# Patient Record
Sex: Male | Born: 1955 | Race: White | Hispanic: No | Smoking: Former smoker
Health system: Southern US, Community
[De-identification: ages and names within clinical notes are randomized; demographics above are authoritative.]

## PROBLEM LIST (undated history)

## (undated) DIAGNOSIS — K219 Gastro-esophageal reflux disease without esophagitis: Secondary | ICD-10-CM

## (undated) DIAGNOSIS — J45909 Unspecified asthma, uncomplicated: Secondary | ICD-10-CM

## (undated) DIAGNOSIS — I10 Essential (primary) hypertension: Secondary | ICD-10-CM

## (undated) DIAGNOSIS — E785 Hyperlipidemia, unspecified: Secondary | ICD-10-CM

## (undated) DIAGNOSIS — M109 Gout, unspecified: Secondary | ICD-10-CM

## (undated) HISTORY — PX: TONSILLECTOMY: SUR1361

## (undated) HISTORY — PX: BACK SURGERY: SHX140

## (undated) HISTORY — PX: SPINAL FUSION: SHX223

---

## 1998-02-01 ENCOUNTER — Ambulatory Visit (HOSPITAL_COMMUNITY): Admission: RE | Admit: 1998-02-01 | Discharge: 1998-02-01 | Payer: Self-pay | Admitting: Family Medicine

## 2001-07-16 ENCOUNTER — Encounter: Payer: Self-pay | Admitting: Neurosurgery

## 2001-07-17 ENCOUNTER — Encounter: Payer: Self-pay | Admitting: Neurosurgery

## 2001-07-17 ENCOUNTER — Ambulatory Visit (HOSPITAL_COMMUNITY): Admission: RE | Admit: 2001-07-17 | Discharge: 2001-07-17 | Payer: Self-pay | Admitting: Neurosurgery

## 2002-03-15 ENCOUNTER — Ambulatory Visit (HOSPITAL_COMMUNITY): Admission: RE | Admit: 2002-03-15 | Discharge: 2002-03-15 | Payer: Self-pay | Admitting: Neurosurgery

## 2002-03-15 ENCOUNTER — Encounter: Payer: Self-pay | Admitting: Neurosurgery

## 2002-06-01 ENCOUNTER — Emergency Department (HOSPITAL_COMMUNITY): Admission: EM | Admit: 2002-06-01 | Discharge: 2002-06-01 | Payer: Self-pay | Admitting: Emergency Medicine

## 2002-06-01 ENCOUNTER — Encounter: Payer: Self-pay | Admitting: Emergency Medicine

## 2002-06-12 ENCOUNTER — Encounter: Admission: RE | Admit: 2002-06-12 | Discharge: 2002-06-12 | Payer: Self-pay | Admitting: Surgery

## 2002-06-12 ENCOUNTER — Encounter: Payer: Self-pay | Admitting: Surgery

## 2004-01-04 ENCOUNTER — Encounter: Admission: RE | Admit: 2004-01-04 | Discharge: 2004-01-04 | Payer: Self-pay | Admitting: Family Medicine

## 2004-01-18 ENCOUNTER — Encounter: Admission: RE | Admit: 2004-01-18 | Discharge: 2004-01-18 | Payer: Self-pay | Admitting: Family Medicine

## 2004-01-29 ENCOUNTER — Encounter: Admission: RE | Admit: 2004-01-29 | Discharge: 2004-01-29 | Payer: Self-pay | Admitting: Family Medicine

## 2004-08-03 ENCOUNTER — Ambulatory Visit: Payer: Self-pay | Admitting: Family Medicine

## 2004-08-12 ENCOUNTER — Encounter: Admission: RE | Admit: 2004-08-12 | Discharge: 2004-11-10 | Payer: Self-pay | Admitting: Family Medicine

## 2004-09-06 ENCOUNTER — Ambulatory Visit: Payer: Self-pay | Admitting: Endocrinology

## 2004-11-29 ENCOUNTER — Ambulatory Visit: Payer: Self-pay | Admitting: Family Medicine

## 2005-01-16 ENCOUNTER — Ambulatory Visit: Payer: Self-pay | Admitting: Family Medicine

## 2005-02-16 ENCOUNTER — Ambulatory Visit: Payer: Self-pay | Admitting: Family Medicine

## 2005-03-03 ENCOUNTER — Encounter: Admission: RE | Admit: 2005-03-03 | Discharge: 2005-03-03 | Payer: Self-pay | Admitting: Neurosurgery

## 2005-03-05 ENCOUNTER — Encounter: Admission: RE | Admit: 2005-03-05 | Discharge: 2005-03-05 | Payer: Self-pay | Admitting: Neurosurgery

## 2005-03-08 ENCOUNTER — Ambulatory Visit: Payer: Self-pay | Admitting: Endocrinology

## 2005-03-27 ENCOUNTER — Emergency Department (HOSPITAL_COMMUNITY): Admission: EM | Admit: 2005-03-27 | Discharge: 2005-03-27 | Payer: Self-pay | Admitting: Emergency Medicine

## 2005-05-18 ENCOUNTER — Ambulatory Visit: Payer: Self-pay | Admitting: Family Medicine

## 2005-05-23 ENCOUNTER — Ambulatory Visit: Payer: Self-pay | Admitting: Family Medicine

## 2005-05-30 ENCOUNTER — Ambulatory Visit: Payer: Self-pay | Admitting: Family Medicine

## 2005-06-05 ENCOUNTER — Ambulatory Visit: Payer: Self-pay | Admitting: Family Medicine

## 2005-06-08 ENCOUNTER — Ambulatory Visit: Payer: Self-pay | Admitting: Gastroenterology

## 2005-06-21 ENCOUNTER — Ambulatory Visit: Payer: Self-pay | Admitting: Gastroenterology

## 2005-06-29 ENCOUNTER — Ambulatory Visit: Payer: Self-pay | Admitting: Family Medicine

## 2005-08-14 ENCOUNTER — Ambulatory Visit: Payer: Self-pay | Admitting: Family Medicine

## 2005-11-22 ENCOUNTER — Ambulatory Visit: Payer: Self-pay | Admitting: Internal Medicine

## 2005-11-22 ENCOUNTER — Observation Stay (HOSPITAL_COMMUNITY): Admission: EM | Admit: 2005-11-22 | Discharge: 2005-11-23 | Payer: Self-pay | Admitting: Emergency Medicine

## 2005-12-01 ENCOUNTER — Ambulatory Visit: Payer: Self-pay | Admitting: Family Medicine

## 2006-04-20 ENCOUNTER — Ambulatory Visit (HOSPITAL_BASED_OUTPATIENT_CLINIC_OR_DEPARTMENT_OTHER): Admission: RE | Admit: 2006-04-20 | Discharge: 2006-04-20 | Payer: Self-pay | Admitting: Orthopaedic Surgery

## 2006-11-26 ENCOUNTER — Ambulatory Visit: Payer: Self-pay | Admitting: Family Medicine

## 2007-05-21 DIAGNOSIS — K219 Gastro-esophageal reflux disease without esophagitis: Secondary | ICD-10-CM

## 2007-05-21 DIAGNOSIS — I10 Essential (primary) hypertension: Secondary | ICD-10-CM | POA: Insufficient documentation

## 2007-05-21 DIAGNOSIS — Z860100 Personal history of colon polyps, unspecified: Secondary | ICD-10-CM | POA: Insufficient documentation

## 2007-05-21 DIAGNOSIS — Z8719 Personal history of other diseases of the digestive system: Secondary | ICD-10-CM

## 2007-05-21 DIAGNOSIS — R519 Headache, unspecified: Secondary | ICD-10-CM | POA: Insufficient documentation

## 2007-05-21 DIAGNOSIS — R51 Headache: Secondary | ICD-10-CM | POA: Insufficient documentation

## 2007-07-29 ENCOUNTER — Ambulatory Visit: Payer: Self-pay | Admitting: Family Medicine

## 2007-07-29 DIAGNOSIS — J018 Other acute sinusitis: Secondary | ICD-10-CM

## 2007-10-22 ENCOUNTER — Ambulatory Visit: Payer: Self-pay | Admitting: Family Medicine

## 2007-10-22 DIAGNOSIS — J069 Acute upper respiratory infection, unspecified: Secondary | ICD-10-CM | POA: Insufficient documentation

## 2007-10-22 DIAGNOSIS — N6089 Other benign mammary dysplasias of unspecified breast: Secondary | ICD-10-CM

## 2008-03-09 ENCOUNTER — Ambulatory Visit: Payer: Self-pay | Admitting: Family Medicine

## 2008-03-10 LAB — CONVERTED CEMR LAB
AST: 22 units/L (ref 0–37)
Alkaline Phosphatase: 38 units/L — ABNORMAL LOW (ref 39–117)
Basophils Absolute: 0.1 10*3/uL (ref 0.0–0.1)
Bilirubin, Direct: 0.1 mg/dL (ref 0.0–0.3)
CO2: 30 meq/L (ref 19–32)
Creatinine, Ser: 0.9 mg/dL (ref 0.4–1.5)
Glucose, Bld: 88 mg/dL (ref 70–99)
HCT: 44.3 % (ref 39.0–52.0)
Hemoglobin: 15.7 g/dL (ref 13.0–17.0)
MCV: 87.8 fL (ref 78.0–100.0)
Neutrophils Relative %: 60.1 % (ref 43.0–77.0)
Platelets: 165 10*3/uL (ref 150–400)
Potassium: 4.4 meq/L (ref 3.5–5.1)
Sodium: 143 meq/L (ref 135–145)
TSH: 0.81 microintl units/mL (ref 0.35–5.50)
Total Protein: 6.5 g/dL (ref 6.0–8.3)

## 2008-03-23 ENCOUNTER — Emergency Department (HOSPITAL_COMMUNITY): Admission: EM | Admit: 2008-03-23 | Discharge: 2008-03-23 | Payer: Self-pay | Admitting: Emergency Medicine

## 2008-08-05 ENCOUNTER — Ambulatory Visit: Payer: Self-pay | Admitting: Family Medicine

## 2008-08-05 DIAGNOSIS — J209 Acute bronchitis, unspecified: Secondary | ICD-10-CM | POA: Insufficient documentation

## 2008-11-03 ENCOUNTER — Ambulatory Visit: Payer: Self-pay | Admitting: Family Medicine

## 2008-11-03 DIAGNOSIS — L089 Local infection of the skin and subcutaneous tissue, unspecified: Secondary | ICD-10-CM | POA: Insufficient documentation

## 2008-11-20 ENCOUNTER — Ambulatory Visit: Payer: Self-pay | Admitting: Family Medicine

## 2009-03-25 ENCOUNTER — Ambulatory Visit: Payer: Self-pay | Admitting: Family Medicine

## 2009-03-25 DIAGNOSIS — L0293 Carbuncle, unspecified: Secondary | ICD-10-CM

## 2009-03-25 DIAGNOSIS — L0292 Furuncle, unspecified: Secondary | ICD-10-CM | POA: Insufficient documentation

## 2009-03-25 DIAGNOSIS — F411 Generalized anxiety disorder: Secondary | ICD-10-CM | POA: Insufficient documentation

## 2009-04-06 ENCOUNTER — Ambulatory Visit: Payer: Self-pay | Admitting: Family Medicine

## 2010-10-02 ENCOUNTER — Encounter: Payer: Self-pay | Admitting: Neurosurgery

## 2011-01-27 NOTE — Discharge Summary (Signed)
NAMERICHMOND, COLDREN                 ACCOUNT NO.:  [id number]   MEDICAL RECORD NO.:  [id number]          PATIENT TYPE:  INP   LOCATION:  4731                         FACILITY:  [facility]   PHYSICIAN:  Rene Paci, M.D. LHCDATE OF BIRTH:  22-Aug-1956   DATE OF ADMISSION:  11/22/2005  DATE OF DISCHARGE:  11/23/2005                                 DISCHARGE SUMMARY   DISCHARGE DIAGNOSES:  1.  Atypical chest pain.  2.  Dyslipidemia.  3.  Depression/anxiety.   HISTORY OF PRESENT ILLNESS:  The patient is a 55 year old white male  admitted on November 22, 2005 with chief complaint of chest pain which is left-  sided, substernal, with radiation to the shoulder and neck.  This chest pain  began on the morning of admission.  The patient's neighbor, who is a  Designer, fashion/clothing, checked his blood pressure and noted a systolic  blood pressure at home of 90 per patient.  He had noted that the chest pain  had been intermittent, but had resolved at the time of his evaluation in the  emergency room.  Of note, the patient has a history of low back pain and had  received an epidural steroid injection on November 21, 2005.   PAST MEDICAL HISTORY:  1.  Chronic back pain.  2.  Hypertension.  3.  Depression/anxiety.   COURSE OF HOSPITALIZATION:  PROBLEM #1 - ATYPICAL CHEST PAIN:  The patient  was admitted and he underwent serial cardiac enzymes, which were negative  x3.  EKG showed sinus bradycardia with no changes.  He was maintained on  telemetry overnight.  He was noted to have an episode of bradycardia with a  heart rate of 49 and some mild hypotension with a BP of 97/61; his Lotrel  was held during this admission and his heart rate at time of discharge is  currently 70 beats per minute.   PROBLEM #2 - DYSLIPIDEMIA:  The patient was noted to have a total  cholesterol level of 178, HDL of 35 and an LDL of 112.  He states he walks  approximately 1 hour a day 5-6 times per week and that he has been  working  on a low-cholesterol diet.  He was encouraged to continue exercise and diet  modification.   PROBLEM #3 - HYPERTENSION:  This patient's blood pressure was low during  this admission and it may be likely secondary to reaction to recent epidural  steroid injection.  We have advised the patient to hold his Lotrel until his  followup appointment with Dr. Clent Ridges.   MEDICATIONS AT DISCHARGE:  1.  Percocet 7.5/325 mg one tab p.o. q.6 h. p.r.n.  2.  Gabapentin 300 mg p.o. at bedtime.  3.  Lasix 40 mg p.o. daily.  4.  Potassium chloride 20 mEq p.o. daily.  5.  Clonazepam 1 mg p.o. 3 times daily as needed.  6.  Lotrel -- the patient is to hold until follow up with in the hospital      Dr. Abran Cantor.  7.  Cyclobenzaprine 10 mg 3 times daily as needed.  FOLLOWUP:  The patient is to follow up with Dr. Clent Ridges in 1-2 weeks.   DISPOSITION:  Plan is to discharge the patient to home.   DISCHARGE INSTRUCTIONS:  He is instructed to quit using chewing tobacco and  to return to the ER should he develop chest pain, weakness or shortness of  breath.      Melissa S. Peggyann Juba, NP      Rene Paci, M.D. [facility]  Electronically Signed    MSO/MEDQ  D:  11/23/2005  T:  11/25/2005  Job:  [id number]   cc:   Jeannett Senior A. Clent Ridges, M.D. [facility]  [address] [place]  [place] [address]

## 2011-01-27 NOTE — Op Note (Signed)
NAMEJEMAL, Sean Duffy                 ACCOUNT NO.:  [id number]   MEDICAL RECORD NO.:  [id number]          PATIENT TYPE:  AMB   LOCATION:  DSC                          FACILITY:  [facility]   PHYSICIAN:  Sharolyn Douglas, M.D.        DATE OF BIRTH:  1955/12/05   DATE OF PROCEDURE:  04/20/2006  DATE OF DISCHARGE:                                 OPERATIVE REPORT   PREOPERATIVE DIAGNOSES:  1. L5-S1 spondylosis with back and right lower extremity pain.  2. Post laminectomy syndrome.   POSTOPERATIVE DIAGNOSES:  1. L5-S1 spondylosis with back and right lower extremity pain.  2. Post laminectomy syndrome.   PROCEDURE:  1. Bilateral L5-S1 facette joint injections.  2. Right L5 selective nerve root block.  3. Fluoroscopy used for needle placement for the above procedures.   SURGEON:  Sharolyn Douglas, MD.   ASSISTANT:  None.   ANESTHESIA:  MAC plus local.   COMPLICATIONS:  None.   INDICATIONS:  The patient is a pleasant 55 year old male with persistent  back and right lower extremity pain.  He has had a previous laminotomy at L5-  S1.  He has failed to respond to other conservative treatment measures and  now elects to undergo bilateral L5-S1 facette injections and right L5  selective nerve root block in hopes of improving his symptoms.  The risks  and benefits were reviewed.   DESCRIPTION OF PROCEDURE:  He was identified in the holding area after  informed consent and taken to the operating room.  He was turned prone.  Given sedation.  The back was prepped and draped in the usual sterile  fashion.  Fluoroscopy brought into the field.  The L5-S1 facette joints were  identified.  Small 1 mL of 1% lidocaine used over the planned injection site  bilaterally.  A 22-gauge 3-1/2 inch spinal needle then advanced towards the  L5-S1 facette joints bilaterally.  Using fluoroscopy, the needle was placed  at the superior portion of the joint.  1 mL of Omnipaque injected confirming  intra-articular spread  bilaterally.  We then injected 20 mg of Depo-Medrol  and 1 mL of 1% lidocaine into each facette joint.  We then turned our  attention to performing a right L5 selective nerve root block.  Oblique  imaging of the L5 vertebral body was obtained.  A 22gauge Quincke needle was  placed using fluoroscopy under the 6 o'clock position of the right L5  pedicle.  Aspiration was performed, there was no blood, no  CSF.  We then injected a 1/2 mL of Omnipaque confirming epidural spread.  A  solution of 120 mg Depo-Medrol and 3 mL 1% preservative-free lidocaine then  injected.  Band-Aids were placed.  The patient was transferred to recovery  in stable condition.  Post injection instructions were reviewed.  He will  follow-up in 2 weeks.      Sharolyn Douglas, M.D.  Electronically Signed     MC/MEDQ  D:  04/20/2006  T:  04/20/2006  Job:  [id number]

## 2011-08-19 ENCOUNTER — Encounter: Payer: Self-pay | Admitting: Family Medicine

## 2011-08-19 ENCOUNTER — Ambulatory Visit (INDEPENDENT_AMBULATORY_CARE_PROVIDER_SITE_OTHER): Payer: Self-pay | Admitting: Family Medicine

## 2011-08-19 VITALS — BP 158/100 | HR 52 | Temp 98.0°F | Wt 201.0 lb

## 2011-08-19 DIAGNOSIS — J209 Acute bronchitis, unspecified: Secondary | ICD-10-CM

## 2011-08-19 MED ORDER — PREDNISONE 20 MG PO TABS: ORAL_TABLET | ORAL | Status: DC

## 2011-08-19 MED ORDER — AZITHROMYCIN 250 MG PO TABS: ORAL_TABLET | ORAL | Status: DC

## 2011-08-19 NOTE — Progress Notes (Signed)
OFFICE NOTE  08/20/2011  CC:  Chief Complaint  Patient presents with  . Cough    productive w/thick mucus  . Sinusitis    x 1 week     HPI:   Patient is a 55 y.o. Caucasian male who is here for cough/resp sx's. Pt presents complaining of respiratory symptoms for 12-14 days.  Minimal nasal congestion/runny nose, sneezing, and PND cough-- describes the worst symptoms as being chest tightness, coughing excessively, +productive.  Lately the symptoms seem to be worsening . No fevers, no wheezing, and no SOB.  No pain in face or teeth.  No significant HA.  ST mild at most.  Symptoms made worse by deep breaths..  Symptoms improved by mucinex and nyquil minimally. Smoker? no Recent sick contact? No  Muscle or joint aches? No No flu vaccine this season  ROS: no n/v/d or abdominal pain.  No rash.  No neck stiffness.   +Mild fatigue.  +Mild appetite loss.   Pertinent PMH:  HTN Hx of acute bronchitis 3-4 times per year Anxiety  MEDS;  Mucinex, nyquil No outpatient prescriptions prior to visit.    PE: Blood pressure 158/100, pulse 52, temperature 98 F (36.7 C), temperature source Oral, weight 201 lb (91.173 kg), SpO2 95.00%. VS: noted--normal. Gen: alert, NAD, NONTOXIC APPEARING. HEENT: eyes without injection, drainage, or swelling.  Ears: EACs clear, TMs with normal light reflex and landmarks.  Nose: Clear rhinorrhea, with some dried, crusty exudate adherent to mildly injected mucosa.  No purulent d/c.  No paranasal sinus TTP.  No facial swelling.  Throat and mouth without focal lesion.  No pharyngial swelling, erythema, or exudate.   Neck: supple, no LAD.   LUNGS: CTA bilat, nonlabored resps.  Some post-exhalation coughing occasionally. CV: RRR, no m/r/g. EXT: no c/c/e SKIN: no rash    IMPRESSION AND PLAN:  ACUTE BRONCHITIS Prolonged, worsening. Prednisone 40mg  qd x 5d Z-pack. Ventolin sample given: 2 puffs q4h prn.   Avoid OTC decongestants-may increase your  BP.  FOLLOW UP:  Return if symptoms worsen or fail to improve.

## 2011-08-20 NOTE — Assessment & Plan Note (Signed)
Prolonged, worsening. Prednisone 40mg  qd x 5d Z-pack. Ventolin sample given: 2 puffs q4h prn.

## 2015-05-07 ENCOUNTER — Encounter: Payer: Self-pay | Admitting: Gastroenterology

## 2017-03-05 ENCOUNTER — Emergency Department (HOSPITAL_BASED_OUTPATIENT_CLINIC_OR_DEPARTMENT_OTHER)
Admission: EM | Admit: 2017-03-05 | Discharge: 2017-03-05 | Disposition: A | Discharge status: Home / Self Care | Payer: Non-veteran care | Attending: Emergency Medicine | Admitting: Emergency Medicine

## 2017-03-05 ENCOUNTER — Encounter (HOSPITAL_BASED_OUTPATIENT_CLINIC_OR_DEPARTMENT_OTHER): Payer: Self-pay | Admitting: *Deleted

## 2017-03-05 DIAGNOSIS — J3489 Other specified disorders of nose and nasal sinuses: Secondary | ICD-10-CM

## 2017-03-05 DIAGNOSIS — Z79899 Other long term (current) drug therapy: Secondary | ICD-10-CM | POA: Diagnosis not present

## 2017-03-05 DIAGNOSIS — J45909 Unspecified asthma, uncomplicated: Secondary | ICD-10-CM | POA: Diagnosis not present

## 2017-03-05 DIAGNOSIS — Z87891 Personal history of nicotine dependence: Secondary | ICD-10-CM | POA: Diagnosis not present

## 2017-03-05 DIAGNOSIS — I10 Essential (primary) hypertension: Secondary | ICD-10-CM | POA: Insufficient documentation

## 2017-03-05 DIAGNOSIS — R600 Localized edema: Secondary | ICD-10-CM | POA: Diagnosis present

## 2017-03-05 HISTORY — DX: Unspecified asthma, uncomplicated: J45.909

## 2017-03-05 HISTORY — DX: Essential (primary) hypertension: I10

## 2017-03-05 HISTORY — DX: Gastro-esophageal reflux disease without esophagitis: K21.9

## 2017-03-05 HISTORY — DX: Hyperlipidemia, unspecified: E78.5

## 2017-03-05 HISTORY — DX: Gout, unspecified: M10.9

## 2017-03-05 MED ORDER — HYDROCODONE-ACETAMINOPHEN 5-325 MG PO TABS
1.0000 | ORAL_TABLET | Freq: Once | ORAL | Status: AC
  Administered 2017-03-05: 1 via ORAL
  Filled 2017-03-05: qty 1

## 2017-03-05 MED ORDER — CEPHALEXIN 250 MG PO CAPS
500.0000 mg | ORAL_CAPSULE | Freq: Once | ORAL | Status: AC
  Administered 2017-03-05: 500 mg via ORAL
  Filled 2017-03-05: qty 2

## 2017-03-05 MED ORDER — CEPHALEXIN 500 MG PO CAPS: ORAL_CAPSULE | ORAL | 0 refills | Status: DC

## 2017-03-05 MED ORDER — HYDROCODONE-ACETAMINOPHEN 5-325 MG PO TABS: 1.0000 | ORAL_TABLET | Freq: Four times a day (QID) | ORAL | 0 refills | Status: AC | PRN

## 2017-03-05 NOTE — Discharge Instructions (Signed)
Take Tylenol for mild pain or the pain medicine prescribed for bad pain. Don't take Tylenol together with the pain medicine prescribed as the combination could be dangerous to your liver. It is best to avoid ibuprofen since you have issues with blood pressure and ibuprofen can harm her kidneys. Call Dr. Thurmon FairShoemaker's office tomorrow to schedule next available appointment

## 2017-03-05 NOTE — ED Notes (Signed)
Pt verbalizes understanding of d/c instructions and denies any further need at this time. 

## 2017-03-05 NOTE — ED Notes (Signed)
Pt's wife came to the desk asking when the doctor will be in to see him because he is still in pain.  Pt offered tylenol again and he states that the ibuprofen was not helpful so he refused the tylenol again.

## 2017-03-05 NOTE — ED Triage Notes (Signed)
Pt c/o ? Abscess in right nare x 4 days

## 2017-03-05 NOTE — ED Provider Notes (Signed)
[facility] Note   CSN: [id number] Arrival date & time: 03/05/17  1753  By signing my name below, I, Vista Mink, attest that this documentation has been prepared under the direction and in the presence of Doug Sou, MD. Electronically signed, Vista Mink, ED Scribe. 03/05/17. 9:03 PM.  History   Chief Complaint Chief Complaint  Patient presents with  . Facial Swelling    HPI HPI Comments: Sean Duffy is a 61 y.o. male, with Hx of HTN, HLD, who presents to the Emergency Department complaining of gradually worsening pain in his right nostril that started two days ago. He rates his pain an 8/10 currently and also reports associated swelling that he describes at the top of the right nostril. Pt was seen at the [place] earlier today and he states that the provider there was unable to visualize anything in his right nostril. He does not have a current ENT doctor to follow up with and pt is requesting a referral. Pt also reports clear drainage from his nose, no pain in the eye. Per triage note, pt took 800mg  of ibuprofen prior to arrival with no relief of pain. No other associated symptoms noted. Pt denies any illicit drug use, alcohol use or tobacco use.  The history is provided by the patient. No language interpreter was used.    Past Medical History:  Diagnosis Date  . Asthma   . GERD (gastroesophageal reflux disease)   . Gout   . Hyperlipidemia   . Hypertension     Patient Active Problem List   Diagnosis Date Noted  . ANXIETY STATE, UNSPECIFIED 03/25/2009  . FURUNCLE 03/25/2009  . UNSPEC LOCAL INFECTION SKIN&SUBCUTANEOUS TISSUE 11/03/2008  . ACUTE BRONCHITIS 08/05/2008  . VIRAL URI 10/22/2007  . SEBACEOUS CYST, BREAST 10/22/2007  . OTHER ACUTE SINUSITIS 07/29/2007  . HYPERTENSION 05/21/2007  . GERD 05/21/2007  . HEADACHE 05/21/2007  . COLONIC POLYPS, HX OF 05/21/2007  . DIVERTICULITIS, HX OF 05/21/2007    Past Surgical History:  Procedure  Laterality Date  . BACK SURGERY    . SPINAL FUSION    . TONSILLECTOMY        Home Medications    Prior to Admission medications   Medication Sig Start Date End Date Taking? Authorizing Provider  albuterol (PROVENTIL HFA;VENTOLIN HFA) 108 (90 Base) MCG/ACT inhaler Inhale into the lungs every 6 (six) hours as needed for wheezing or shortness of breath.   Yes [provider]  allopurinol (ZYLOPRIM) 300 MG tablet Take 300 mg by mouth daily.   Yes [provider]  amLODipine (NORVASC) 5 MG tablet Take 5 mg by mouth daily.   Yes [provider]  atorvastatin (LIPITOR) 40 MG tablet Take 40 mg by mouth daily.   Yes [provider]  omeprazole (PRILOSEC) 40 MG capsule Take 40 mg by mouth daily.   Yes [provider]  Ascorbic Acid (VITAMIN C) 100 MG tablet Take 100 mg by mouth daily.      [provider]  fish oil-omega-3 fatty acids 1000 MG capsule Take 2 g by mouth daily.      [provider]  vitamin E 100 UNIT capsule Take 100 Units by mouth daily.      [provider]    Family History History reviewed. No pertinent family history.  Social History Social History  Substance Use Topics  . Smoking status: Former Smoker    Types: Cigars    Quit date: 08/18/1996  . Smokeless tobacco:  Not on file  . Alcohol use No     Allergies   Sulfonamide derivatives   Review of Systems Review of Systems  Constitutional: Positive for fever.  HENT: Positive for rhinorrhea.        Pain  in in nose  All other systems reviewed and are negative.    Physical Exam Updated Vital Signs BP (!) 147/86   Pulse 75   Temp 98.6 F (37 C)   Resp 16   Ht 5\' 9"  (1.753 m)   Wt 230 lb (104.3 kg)   SpO2 100%   BMI 33.97 kg/m   Physical Exam  Constitutional: He is oriented to person, place, and time. He appears well-developed and well-nourished. No distress.  HENT:  Head: Normocephalic and atraumatic.  Right Nasal mucosa red  appearing and swollen. No obvious abscess or fluctuance. Right side of nose tender.  Cardiovascular: Normal rate.   Pulmonary/Chest: Effort normal.  Abdominal: He exhibits no distension.  Musculoskeletal: Normal range of motion.  Neurological: He is oriented to person, place, and time. No cranial nerve deficit.  Nursing note and vitals reviewed.    ED Treatments / Results  DIAGNOSTIC STUDIES: Oxygen Saturation is 100% on RA, normal by my interpretation.  COORDINATION OF CARE: 9:03 PM-Discussed treatment plan with pt at bedside and pt agreed to plan.   Labs (all labs ordered are listed, but only abnormal results are displayed) Labs Reviewed - No data to display  EKG  EKG Interpretation None       Radiology No results found.  Procedures Procedures (including critical care time)  Medications Ordered in ED Medications - No data to display   Initial Impression / Assessment and Plan / ED Course  I have reviewed the triage vital signs and the nursing notes.  Pertinent labs & imaging results that were available during my care of the patient were reviewed by me and considered in my medical decision making (see chart for details).     Suspect cellulitis or soft tissue infection. He did not have proper instrumentation to fully examine inside of patient's nose Plan prescription Keflex, Norco. [facility] Clinical [facility]ressions([facility]) / ED Diagnoses  Dx nasal pain Final diagnoses:  None    New Prescriptions New Prescriptions   No medications on file  I personally performed the services described in this documentation, which was scribed in my presence. The recorded information has been reviewed and considered.    Doug SouJacubowitz, Deejay Koppelman, MD 03/05/17 2113

## 2017-03-05 NOTE — ED Notes (Signed)
Pt rang out bc his pain is a "9 out of 10."  Pt took 800mg  ibuprofen prior to arrival, offered pt tylenol, he refused stating "that's not going to work."

## 2017-03-31 ENCOUNTER — Emergency Department (HOSPITAL_COMMUNITY)
Admission: EM | Admit: 2017-03-31 | Discharge: 2017-03-31 | Disposition: A | Discharge status: Home / Self Care | Payer: Non-veteran care | Attending: Emergency Medicine | Admitting: Emergency Medicine

## 2017-03-31 ENCOUNTER — Encounter (HOSPITAL_COMMUNITY): Payer: Self-pay

## 2017-03-31 ENCOUNTER — Emergency Department (HOSPITAL_COMMUNITY): Payer: Non-veteran care

## 2017-03-31 DIAGNOSIS — W240XXA Contact with lifting devices, not elsewhere classified, initial encounter: Secondary | ICD-10-CM | POA: Insufficient documentation

## 2017-03-31 DIAGNOSIS — Y999 Unspecified external cause status: Secondary | ICD-10-CM | POA: Diagnosis not present

## 2017-03-31 DIAGNOSIS — Z79899 Other long term (current) drug therapy: Secondary | ICD-10-CM | POA: Diagnosis not present

## 2017-03-31 DIAGNOSIS — I10 Essential (primary) hypertension: Secondary | ICD-10-CM | POA: Diagnosis not present

## 2017-03-31 DIAGNOSIS — J45909 Unspecified asthma, uncomplicated: Secondary | ICD-10-CM | POA: Diagnosis not present

## 2017-03-31 DIAGNOSIS — Y9389 Activity, other specified: Secondary | ICD-10-CM | POA: Insufficient documentation

## 2017-03-31 DIAGNOSIS — Z87891 Personal history of nicotine dependence: Secondary | ICD-10-CM | POA: Insufficient documentation

## 2017-03-31 DIAGNOSIS — Y929 Unspecified place or not applicable: Secondary | ICD-10-CM | POA: Insufficient documentation

## 2017-03-31 DIAGNOSIS — T148XXA Other injury of unspecified body region, initial encounter: Secondary | ICD-10-CM

## 2017-03-31 DIAGNOSIS — S0081XA Abrasion of other part of head, initial encounter: Secondary | ICD-10-CM | POA: Diagnosis not present

## 2017-03-31 DIAGNOSIS — S098XXA Other specified injuries of head, initial encounter: Secondary | ICD-10-CM | POA: Diagnosis present

## 2017-03-31 DIAGNOSIS — S060X0A Concussion without loss of consciousness, initial encounter: Secondary | ICD-10-CM

## 2017-03-31 MED ORDER — HYDROCODONE-ACETAMINOPHEN 5-325 MG PO TABS
2.0000 | ORAL_TABLET | Freq: Once | ORAL | Status: AC
  Administered 2017-03-31: 2 via ORAL
  Filled 2017-03-31: qty 2

## 2017-03-31 NOTE — ED Provider Notes (Signed)
[facility]-EMERGENCY DEPT Provider Note   CSN: [id number] Arrival date & time: 03/31/17  1106     History   Chief Complaint No chief complaint on file.   HPI Sean Duffy is a 61 y.o. male.  Patient is 61 year old male who presents after a head injury. He was using a hydraulic jack and it bounced up and hit him in the forehead. He denies any loss of consciousness but states that he felt like he almost passed out. He's had some ongoing dizziness. He's had some nausea but no vomiting. His tetanus shot is up-to-date. He denies any other injuries from the incident. He initially denies any neck or back pain although he has some chronic neck and back pain related to prior disc disease. He currently denies any pain. He denies any numbness or weakness to his extremities.      Past Medical History:  Diagnosis Date  . Asthma   . GERD (gastroesophageal reflux disease)   . Gout   . Hyperlipidemia   . Hypertension     Patient Active Problem List   Diagnosis Date Noted  . ANXIETY STATE, UNSPECIFIED 03/25/2009  . FURUNCLE 03/25/2009  . UNSPEC LOCAL INFECTION SKIN&SUBCUTANEOUS TISSUE 11/03/2008  . ACUTE BRONCHITIS 08/05/2008  . VIRAL URI 10/22/2007  . SEBACEOUS CYST, BREAST 10/22/2007  . OTHER ACUTE SINUSITIS 07/29/2007  . HYPERTENSION 05/21/2007  . GERD 05/21/2007  . HEADACHE 05/21/2007  . COLONIC POLYPS, HX OF 05/21/2007  . DIVERTICULITIS, HX OF 05/21/2007    Past Surgical History:  Procedure Laterality Date  . BACK SURGERY    . SPINAL FUSION    . TONSILLECTOMY         Home Medications    Prior to Admission medications   Medication Sig Start Date End Date Taking? Authorizing Provider  albuterol (PROVENTIL HFA;VENTOLIN HFA) 108 (90 Base) MCG/ACT inhaler Inhale into the lungs every 6 (six) hours as needed for wheezing or shortness of breath.    [provider]  allopurinol (ZYLOPRIM) 300 MG tablet Take 300 mg by mouth daily.    [provider]  amLODipine  (NORVASC) 5 MG tablet Take 5 mg by mouth daily.    [provider]  Ascorbic Acid (VITAMIN C) 100 MG tablet Take 100 mg by mouth daily.      [provider]  atorvastatin (LIPITOR) 40 MG tablet Take 40 mg by mouth daily.    [provider]  cephALEXin (KEFLEX) 500 MG capsule One by mouth 3 times daily 03/05/17   Doug Sou, MD  fish oil-omega-3 fatty acids 1000 MG capsule Take 2 g by mouth daily.      [provider]  HYDROcodone-acetaminophen (NORCO) 5-325 MG tablet Take 1-2 tablets by mouth every 6 (six) hours as needed for severe pain. 03/05/17   Doug Sou, MD  omeprazole (PRILOSEC) 40 MG capsule Take 40 mg by mouth daily.    [provider]  vitamin E 100 UNIT capsule Take 100 Units by mouth daily.      [provider]    Family History No family history on file.  Social History Social History  Substance Use Topics  . Smoking status: Former Smoker    Types: Cigars    Quit date: 08/18/1996  . Smokeless tobacco: Never Used  . Alcohol use No     Allergies   Sulfonamide derivatives   Review of Systems Review of Systems  Constitutional: Negative for activity change, appetite change and fever.  HENT: Negative for dental  problem, nosebleeds and trouble swallowing.   Eyes: Negative for pain and visual disturbance.  Respiratory: Negative for shortness of breath.   Cardiovascular: Negative for chest pain.  Gastrointestinal: Negative for abdominal pain, nausea and vomiting.  Genitourinary: Negative for dysuria and hematuria.  Musculoskeletal: Positive for neck pain. Negative for arthralgias, back pain and joint swelling.  Skin: Positive for wound.  Neurological: Positive for headaches. Negative for weakness and numbness.  Psychiatric/Behavioral: Negative for confusion.     Physical Exam Updated Vital Signs BP (!) 149/96   Pulse 67   Temp 98.5 F (36.9 C) (Oral)   Resp 16   SpO2 95%   Physical Exam    Constitutional: He is oriented to person, place, and time. He appears well-developed and well-nourished.  HENT:  Head: Normocephalic.  Nose: Nose normal.  Positive abrasion with a superficial laceration to his mid forehead. There is no active bleeding.  Eyes: Pupils are equal, round, and reactive to light. Conjunctivae are normal.  Neck:  No pain to the cervical, thoracic, or LS spine.  No step-offs or deformities noted  Cardiovascular: Normal rate and regular rhythm.   No murmur heard. Pulmonary/Chest: Effort normal and breath sounds normal. No respiratory distress. He has no wheezes. He exhibits no tenderness.  Abdominal: Soft. Bowel sounds are normal. He exhibits no distension. There is no tenderness.  Musculoskeletal: Normal range of motion.  No pain on palpation or ROM of the extremities  Neurological: He is alert and oriented to person, place, and time.  Motor 5 out of 5 all extremities, sensation grossly intact to light touch all extremities  Skin: Skin is warm and dry. Capillary refill takes less than 2 seconds.  Psychiatric: He has a normal mood and affect.  Vitals reviewed.    ED Treatments / Results  Labs (all labs ordered are listed, but only abnormal results are displayed) Labs Reviewed - No data to display  EKG  EKG Interpretation None       Radiology Ct Head Wo Contrast  Result Date: 03/31/2017 CLINICAL DATA:  Hit forehead with hydraulic jack, abrasion/bruising EXAM: CT HEAD WITHOUT CONTRAST CT CERVICAL SPINE WITHOUT CONTRAST TECHNIQUE: Multidetector CT imaging of the head and cervical spine was performed following the standard protocol without intravenous contrast. Multiplanar CT image reconstructions of the cervical spine were also generated. COMPARISON:  MR cervical spine dated 03/05/2005. MR brain dated 01/29/2004. CT head dated 01/18/2004. FINDINGS: CT HEAD FINDINGS Brain: No evidence of acute infarction, hemorrhage, hydrocephalus, extra-axial collection or  mass lesion/mass effect. Vascular: No hyperdense vessel or unexpected calcification. Skull: Normal. Negative for fracture or focal lesion. Sinuses/Orbits: The visualized paranasal sinuses are essentially clear. The mastoid air cells are unopacified. Other: Mild soft tissue swelling overlying the right frontal bone. CT CERVICAL SPINE FINDINGS Alignment: Normal cervical lordosis. Skull base and vertebrae: No acute fracture. No primary bone lesion or focal pathologic process. Soft tissues and spinal canal: No prevertebral fluid or swelling. No visible canal hematoma. Disc levels:  Mild degenerative changes at C5-6. Spinal canal is patent. Upper chest: Visualized lung apices are clear. Other: Visualized thyroid is unremarkable. IMPRESSION: Mild soft tissue swelling overlying the right frontal bone. No evidence of acute intracranial abnormality. No evidence of traumatic injury to the cervical spine. Electronically Signed   By: Charline Bills M.D.   On: 03/31/2017 13:16   Ct Cervical Spine Wo Contrast  Result Date: 03/31/2017 CLINICAL DATA:  Hit forehead with hydraulic jack, abrasion/bruising EXAM: CT HEAD WITHOUT CONTRAST CT CERVICAL SPINE  WITHOUT CONTRAST TECHNIQUE: Multidetector CT imaging of the head and cervical spine was performed following the standard protocol without intravenous contrast. Multiplanar CT image reconstructions of the cervical spine were also generated. COMPARISON:  MR cervical spine dated 03/05/2005. MR brain dated 01/29/2004. CT head dated 01/18/2004. FINDINGS: CT HEAD FINDINGS Brain: No evidence of acute infarction, hemorrhage, hydrocephalus, extra-axial collection or mass lesion/mass effect. Vascular: No hyperdense vessel or unexpected calcification. Skull: Normal. Negative for fracture or focal lesion. Sinuses/Orbits: The visualized paranasal sinuses are essentially clear. The mastoid air cells are unopacified. Other: Mild soft tissue swelling overlying the right frontal bone. CT  CERVICAL SPINE FINDINGS Alignment: Normal cervical lordosis. Skull base and vertebrae: No acute fracture. No primary bone lesion or focal pathologic process. Soft tissues and spinal canal: No prevertebral fluid or swelling. No visible canal hematoma. Disc levels:  Mild degenerative changes at C5-6. Spinal canal is patent. Upper chest: Visualized lung apices are clear. Other: Visualized thyroid is unremarkable. IMPRESSION: Mild soft tissue swelling overlying the right frontal bone. No evidence of acute intracranial abnormality. No evidence of traumatic injury to the cervical spine. Electronically Signed   By: Charline BillsSriyesh  Krishnan M.D.   On: 03/31/2017 13:16    Procedures Procedures (including critical care time)  Medications Ordered in ED Medications  HYDROcodone-acetaminophen (NORCO/VICODIN) 5-325 MG per tablet 2 tablet (2 tablets Oral Given 03/31/17 1335)     Initial Impression / Assessment and Plan / ED Course  I have reviewed the triage vital signs and the nursing notes.  Pertinent labs & imaging results that were available during my care of the patient were reviewed by me and considered in my medical decision making (see chart for details).  Clinical Course as of Mar 31 1350  Sat Mar 31, 2017  1225 Patient states that he was moving his head around and developed onset of neck pain.  [MB]    Clinical Course User Index [MB] Rolan BuccoBelfi, Rena Hunke, MD    Pt Presents after a head injury. There is no evidence of intracranial hemorrhage. No evidence of spinal injury. He has no neurologic deficits. He is discharged home in good condition. He was given concussion instructions. He was given wound care instructions. He was instructed to follow-up with his PCP. Return precautions were given.  Final Clinical Impressions(s) / ED Diagnoses   Final diagnoses:  Concussion without loss of consciousness, initial encounter  Abrasion    New Prescriptions New Prescriptions   No medications on file       Rolan BuccoBelfi, Rehema Muffley, MD 03/31/17 1352

## 2017-03-31 NOTE — ED Triage Notes (Signed)
Patient here with abrasion/bruising to forehead after hitting self in head with jack this am. No loc but complains of dizziness, minimal bleeding

## 2017-09-08 IMAGING — CT CT CERVICAL SPINE W/O CM
5 of 8 series · 13 of 33 positions shown, 14 images · non-contrast
Comparison: MR cervical spine dated 03/05/2005. MR brain dated
01/29/2004. CT head dated 01/18/2004.

CLINICAL DATA: Hit forehead with hydraulic Aujla, abrasion/bruising

EXAM:
CT HEAD WITHOUT CONTRAST
CT CERVICAL SPINE WITHOUT CONTRAST
TECHNIQUE: Multidetector CT imaging of the head and cervical spine was
performed following the standard protocol without intravenous
contrast. Multiplanar CT image reconstructions of the cervical spine
were also generated.

[Series 5: head bone · axial · 0.51mm/px · z∈[-59,-5]mm · 2 of 82 slices shown]
[im 28/82  bone]
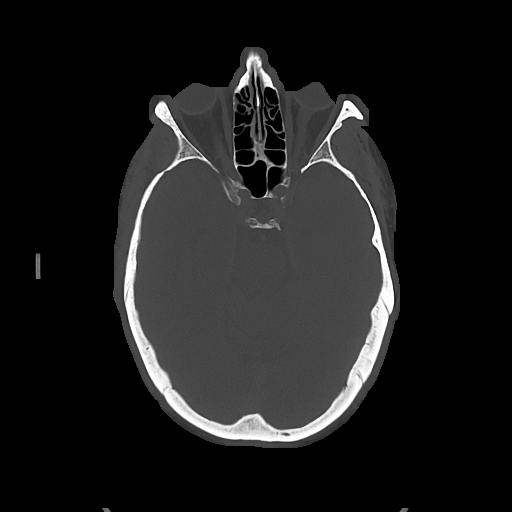
[im 55/82  bone]
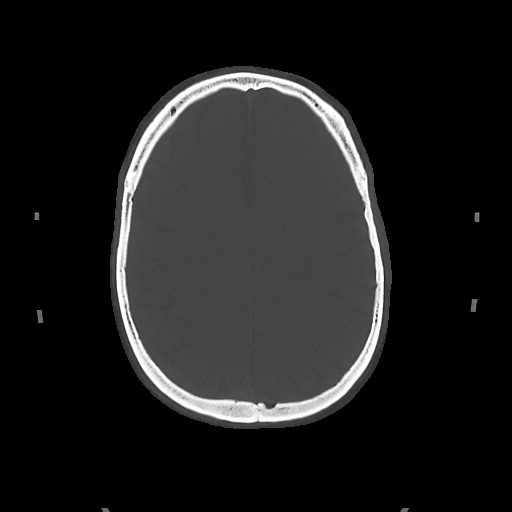

[Series 9: c spine soft · axial · 0.42mm/px · z∈[-215,-109]mm · 3 of 107 slices shown]
[im 27/107  soft-tissue]
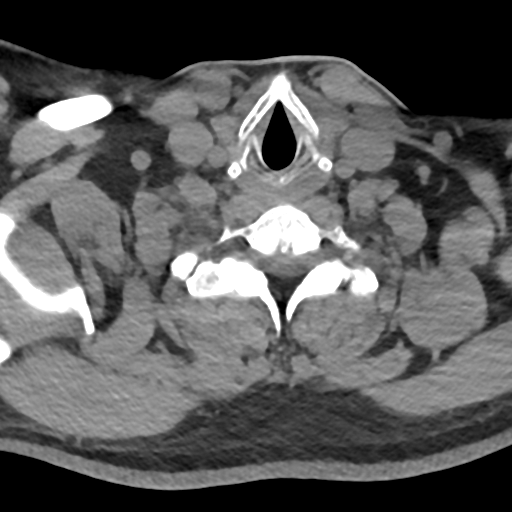
[im 54/107  soft-tissue]
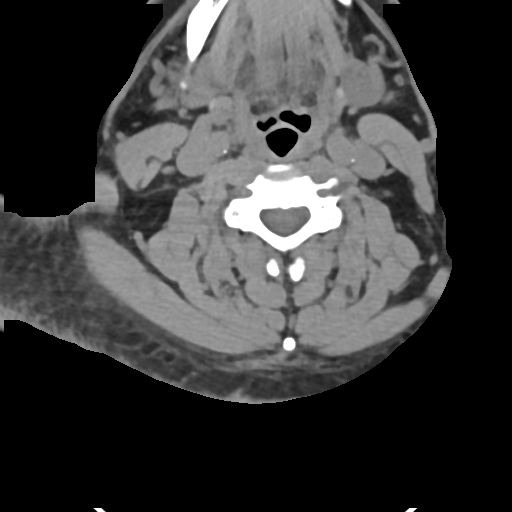
[im 80/107  soft-tissue]
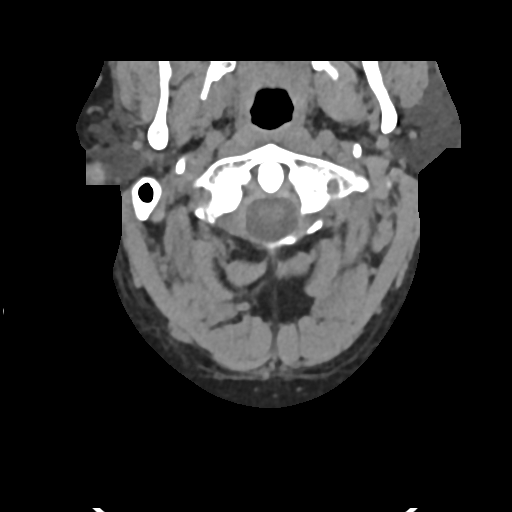

[Series 10: sag bone · sagittal · 0.42mm/px · 5 of 102 slices shown]
[im 15/102  bone]
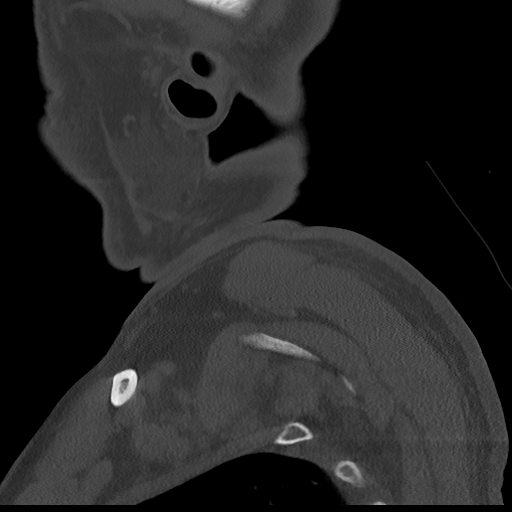
[im 29/102  bone]
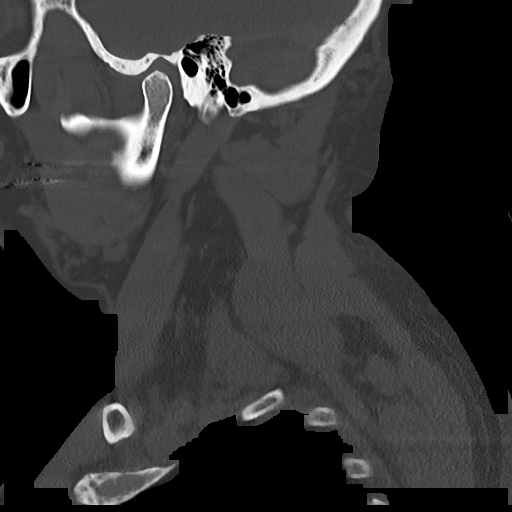
[im 44/102  bone]
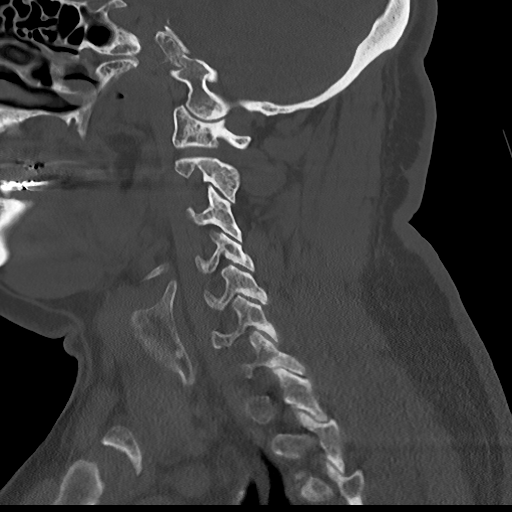
[im 58/102  bone]
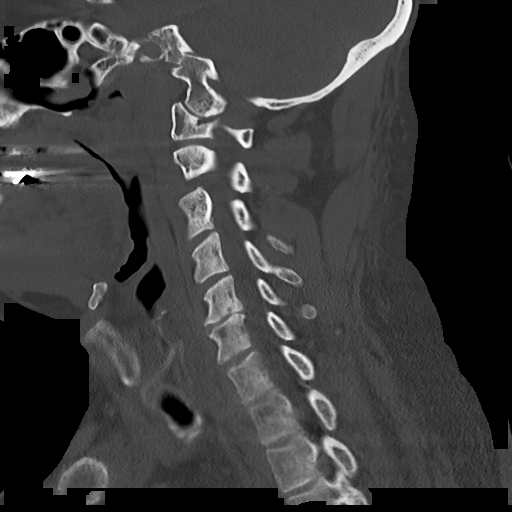
[im 73/102  bone]
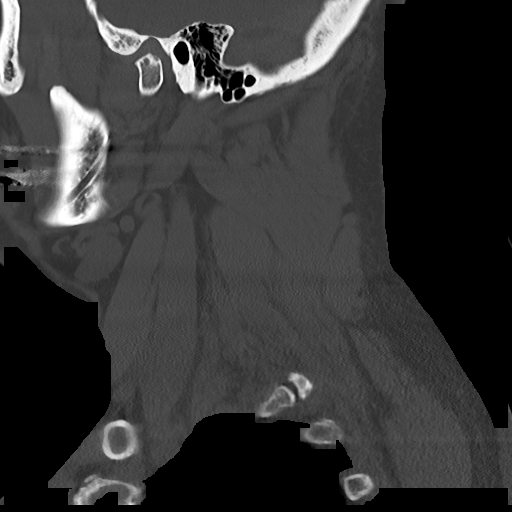

[Series 11: cor bone · coronal · 0.42mm/px · 1 of 103 slices shown]
[im 52/103  bone]
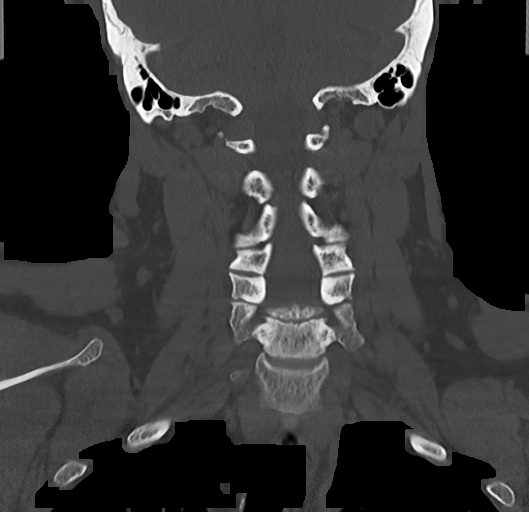

[Series 12: orthogonal axials · axial · 0.21mm/px · z∈[-219,-163]mm · 2 of 96 slices shown, 3 images]
[im 32/96  soft-tissue]
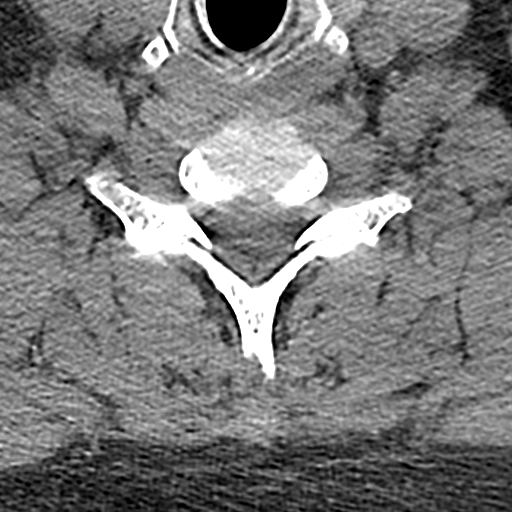
[im 32/96  bone]
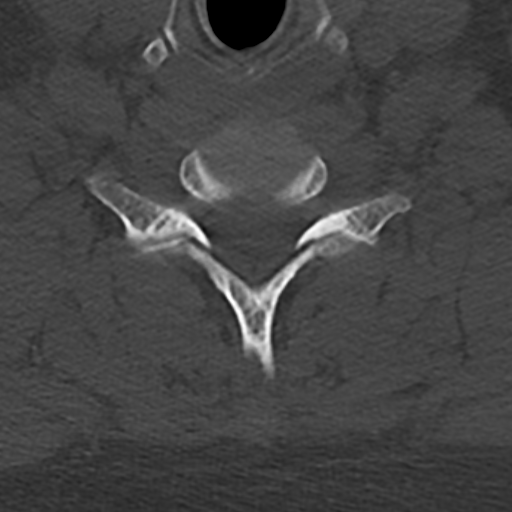
[im 64/96  bone]
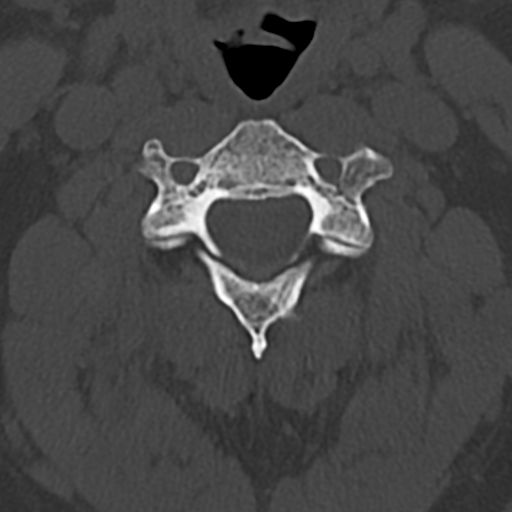

[13 of 33 positions shown; findings below may reference images not displayed]

FINDINGS: CT HEAD FINDINGS

Brain: No evidence of acute infarction, hemorrhage, hydrocephalus,
extra-axial collection or mass lesion/mass effect.

Vascular: No hyperdense vessel or unexpected calcification.

Skull: Normal. Negative for fracture or focal lesion.

Sinuses/Orbits: The visualized paranasal sinuses are essentially
clear. The mastoid air cells are unopacified.

Other: Mild soft tissue swelling overlying the right frontal bone.

CT CERVICAL SPINE FINDINGS

Alignment: Normal cervical lordosis.

Skull base and vertebrae: No acute fracture. No primary bone lesion
or focal pathologic process.

Soft tissues and spinal canal: No prevertebral fluid or swelling. No
visible canal hematoma.

Disc levels:  Mild degenerative changes at C5-6.

Spinal canal is patent.

Upper chest: Visualized lung apices are clear.

Other: Visualized thyroid is unremarkable.
IMPRESSION: Mild soft tissue swelling overlying the right frontal bone. No
evidence of acute intracranial abnormality.

No evidence of traumatic injury to the cervical spine.

## 2017-09-14 ENCOUNTER — Ambulatory Visit (INDEPENDENT_AMBULATORY_CARE_PROVIDER_SITE_OTHER): Payer: Non-veteran care | Admitting: Podiatry

## 2017-09-14 ENCOUNTER — Emergency Department (HOSPITAL_COMMUNITY): Payer: Non-veteran care

## 2017-09-14 ENCOUNTER — Other Ambulatory Visit: Payer: Self-pay

## 2017-09-14 ENCOUNTER — Encounter: Payer: Self-pay | Admitting: Podiatry

## 2017-09-14 ENCOUNTER — Emergency Department (HOSPITAL_COMMUNITY)
Admission: EM | Admit: 2017-09-14 | Discharge: 2017-09-14 | Disposition: A | Discharge status: Home / Self Care | Payer: Non-veteran care | Attending: Emergency Medicine | Admitting: Emergency Medicine

## 2017-09-14 ENCOUNTER — Encounter (HOSPITAL_COMMUNITY): Payer: Self-pay

## 2017-09-14 VITALS — BP 163/93 | HR 83 | Resp 16

## 2017-09-14 DIAGNOSIS — Z79899 Other long term (current) drug therapy: Secondary | ICD-10-CM | POA: Diagnosis not present

## 2017-09-14 DIAGNOSIS — I10 Essential (primary) hypertension: Secondary | ICD-10-CM | POA: Diagnosis not present

## 2017-09-14 DIAGNOSIS — J45909 Unspecified asthma, uncomplicated: Secondary | ICD-10-CM | POA: Diagnosis not present

## 2017-09-14 DIAGNOSIS — R079 Chest pain, unspecified: Secondary | ICD-10-CM | POA: Diagnosis present

## 2017-09-14 DIAGNOSIS — M779 Enthesopathy, unspecified: Secondary | ICD-10-CM

## 2017-09-14 DIAGNOSIS — Z87891 Personal history of nicotine dependence: Secondary | ICD-10-CM | POA: Diagnosis not present

## 2017-09-14 DIAGNOSIS — M7751 Other enthesopathy of right foot: Secondary | ICD-10-CM | POA: Diagnosis not present

## 2017-09-14 DIAGNOSIS — L84 Corns and callosities: Secondary | ICD-10-CM

## 2017-09-14 DIAGNOSIS — Z7982 Long term (current) use of aspirin: Secondary | ICD-10-CM | POA: Diagnosis not present

## 2017-09-14 DIAGNOSIS — R072 Precordial pain: Secondary | ICD-10-CM | POA: Insufficient documentation

## 2017-09-14 LAB — BASIC METABOLIC PANEL
Anion gap: 9 (ref 5–15)
BUN: 16 mg/dL (ref 6–20)
CO2: 25 mmol/L (ref 22–32)
Calcium: 9.3 mg/dL (ref 8.9–10.3)
Chloride: 105 mmol/L (ref 101–111)
Creatinine, Ser: 1.45 mg/dL — ABNORMAL HIGH (ref 0.61–1.24)
GFR calc Af Amer: 59 mL/min — ABNORMAL LOW (ref >60)
GFR, EST NON AFRICAN AMERICAN: 51 mL/min — AB (ref >60)
Glucose, Bld: 105 mg/dL — ABNORMAL HIGH (ref 65–99)
Potassium: 3.9 mmol/L (ref 3.5–5.1)
SODIUM: 139 mmol/L (ref 135–145)

## 2017-09-14 LAB — CBC
HEMATOCRIT: 48.6 % (ref 39.0–52.0)
Hemoglobin: 16.9 g/dL (ref 13.0–17.0)
MCH: 30.2 pg (ref 26.0–34.0)
MCHC: 34.8 g/dL (ref 30.0–36.0)
MCV: 86.8 fL (ref 78.0–100.0)
PLATELETS: 139 10*3/uL — AB (ref 150–400)
RBC: 5.6 MIL/uL (ref 4.22–5.81)
RDW: 13.4 % (ref 11.5–15.5)
WBC: 6 10*3/uL (ref 4.0–10.5)

## 2017-09-14 LAB — I-STAT TROPONIN, ED
TROPONIN I, POC: 0 ng/mL (ref 0.00–0.08)
Troponin i, poc: 0 ng/mL (ref 0.00–0.08)

## 2017-09-14 NOTE — ED Provider Notes (Signed)
Pt seen and evaluated. D/W Resident. Pt with transient CP and left neck "tingling".  Normal EKG and initial, and delta troponin. Appropriate for DC and daily ASA and Cardiology f/u.   Rolland PorterJames, Aliya Sol, MD 09/14/17 2233

## 2017-09-14 NOTE — ED Triage Notes (Signed)
Pt states he was at foot doctor, he states he began having chest pain after leaving. Pt reports some dizziness as well, pt has dizziness frequently he states. Some SOB as well. Pt alert and oriented, skin warm and dry.

## 2017-09-14 NOTE — ED Provider Notes (Signed)
[facility]   CSN: [id number] Arrival date & time: 09/14/17  1453     History   Chief Complaint Chief Complaint  Patient presents with  . Chest Pain    HPI Sean Duffy is a 62 y.o. male.  The history is provided by the patient and the spouse.  Chest Pain   This is a new problem. The current episode started 3 to 5 hours ago. Episode frequency: first time. The problem has been gradually improving. The pain is associated with rest. The pain is present in the substernal region. The pain is moderate. The quality of the pain is described as sharp and pressure-like. The pain radiates to the left jaw. Pertinent negatives include no abdominal pain, no back pain, no cough, no diaphoresis, no fever, no irregular heartbeat, no lower extremity edema, no nausea, no numbness, no palpitations, no shortness of breath and no vomiting. He has tried nothing for the symptoms. Risk factors include male gender.  His past medical history is significant for anxiety/panic attacks, hyperlipidemia and hypertension.    Past Medical History:  Diagnosis Date  . Asthma   . GERD (gastroesophageal reflux disease)   . Gout   . Hyperlipidemia   . Hypertension     Patient Active Problem List   Diagnosis Date Noted  . ANXIETY STATE, UNSPECIFIED 03/25/2009  . FURUNCLE 03/25/2009  . UNSPEC LOCAL INFECTION SKIN&SUBCUTANEOUS TISSUE 11/03/2008  . ACUTE BRONCHITIS 08/05/2008  . VIRAL URI 10/22/2007  . SEBACEOUS CYST, BREAST 10/22/2007  . OTHER ACUTE SINUSITIS 07/29/2007  . HYPERTENSION 05/21/2007  . GERD 05/21/2007  . HEADACHE 05/21/2007  . COLONIC POLYPS, HX OF 05/21/2007  . DIVERTICULITIS, HX OF 05/21/2007    Past Surgical History:  Procedure Laterality Date  . BACK SURGERY    . SPINAL FUSION    . TONSILLECTOMY         Home Medications    Prior to Admission medications   Medication Sig Start Date End Date Taking? Authorizing Provider    albuterol (PROVENTIL HFA;VENTOLIN HFA) 108 (90 Base) MCG/ACT inhaler Inhale 2 puffs into the lungs every 6 (six) hours as needed for wheezing or shortness of breath.    Yes [provider]  allopurinol (ZYLOPRIM) 300 MG tablet Take 300 mg by mouth at bedtime.    Yes [provider]  amLODipine (NORVASC) 5 MG tablet Take 5 mg by mouth daily.   Yes [provider]  aspirin EC 81 MG tablet Take 81 mg by mouth daily.   Yes [provider]  atorvastatin (LIPITOR) 40 MG tablet Take 40 mg by mouth at bedtime.    Yes [provider]  loratadine (CLARITIN) 10 MG tablet Take 10 mg by mouth daily as needed for allergies.    Yes [provider]  Melatonin 10 MG TABS Take 20 mg by mouth at bedtime.   Yes [provider]  omeprazole (PRILOSEC) 40 MG capsule Take 20 mg by mouth daily before breakfast.    Yes [provider]  PRESCRIPTION MEDICATION Inhale into the lungs at bedtime. CPAP   Yes [provider]  testosterone cypionate (DEPOTESTOSTERONE CYPIONATE) 200 MG/ML injection Inject 200 mg into the muscle every 14 (fourteen) days.    Yes [provider]  HYDROcodone-acetaminophen (NORCO) 5-325 MG tablet Take 1-2 tablets by mouth every 6 (six) hours as needed for severe pain. Patient not taking: Reported on 09/14/2017 03/05/17   Doug SouJacubowitz, Sam, MD    Family  History History reviewed. No pertinent family history.  Social History Social History   Tobacco Use  . Smoking status: Former Smoker    Types: Cigars    Last attempt to quit: 08/18/1996    Years since quitting: 21.0  . Smokeless tobacco: Never Used  Substance Use Topics  . Alcohol use: No  . Drug use: No     Allergies   Sulfa antibiotics   Review of Systems Review of Systems  Constitutional: Negative for chills, diaphoresis and fever.  HENT: Negative for rhinorrhea and sore throat.   Eyes: Negative for visual disturbance.  Respiratory: Negative  for cough and shortness of breath.   Cardiovascular: Positive for chest pain. Negative for palpitations.  Gastrointestinal: Negative for abdominal pain, nausea and vomiting.  Genitourinary: Negative for dysuria.  Musculoskeletal: Negative for arthralgias and back pain.  Skin: Negative for rash.  Neurological: Negative for syncope and numbness.  Psychiatric/Behavioral: Negative for confusion.  All other systems reviewed and are negative.    Physical Exam Updated Vital Signs BP (!) 145/88   Pulse 63   Temp 98.3 F (36.8 C) (Oral)   Resp 18   SpO2 98%   Physical Exam  Constitutional: He is oriented to person, place, and time. He appears well-developed and well-nourished.  HENT:  Head: Normocephalic and atraumatic.  Eyes: Conjunctivae are normal.  Neck: Neck supple.  Cardiovascular: Normal rate and regular rhythm.  No murmur heard. Pulmonary/Chest: Effort normal and breath sounds normal. No respiratory distress.  Abdominal: Soft. There is no tenderness.  Musculoskeletal: Normal range of motion. He exhibits no edema.  Neurological: He is alert and oriented to person, place, and time.  Skin: Skin is warm and dry.  Psychiatric: He has a normal mood and affect.  Nursing note and vitals reviewed.    ED Treatments / Results  Labs (all labs ordered are listed, but only abnormal results are displayed) Labs Reviewed  BASIC METABOLIC PANEL - Abnormal; Notable for the following components:      Result Value   Glucose, Bld 105 (*)    Creatinine, Ser 1.45 (*)    GFR calc non Af Amer 51 (*)    GFR calc Af Amer 59 (*)    All other components within normal limits  CBC - Abnormal; Notable for the following components:   Platelets 139 (*)    All other components within normal limits  I-STAT TROPONIN, ED  I-STAT TROPONIN, ED    EKG  EKG Interpretation  Date/Time:  Friday September 14 2017 14:58:20 EST Ventricular Rate:  73 PR Interval:  160 QRS Duration: 84 QT  Interval:  402 QTC Calculation: 442 R Axis:   30 Text Interpretation:  Normal sinus rhythm Normal ECG Confirmed by Rolland Porter ([id number]) on 09/14/2017 10:25:31 PM       Radiology Dg Chest 2 View  Result Date: 09/14/2017 CLINICAL DATA:  Chest pain.  Nausea. EXAM: CHEST  2 VIEW COMPARISON:  11/20/2008. FINDINGS: Mediastinum and hilar structures normal. Heart size normal. Mild left base infiltrate cannot be excluded. No pleural effusion or pneumothorax. No acute bony abnormality . IMPRESSION: Mild left base infiltrate cannot be excluded . Electronically Signed   By: Maisie Fus  Register   On: 09/14/2017 16:26    Procedures Procedures (including critical care time)  Medications Ordered in ED Medications - No data to display   Initial Impression / Assessment and Plan / ED Course  I have reviewed the triage vital signs and the nursing notes.  Pertinent labs &  imaging results that were available during my care of the patient were reviewed by me and considered in my medical decision making (see chart for details).     Pt with h/o HTN, HLD presents with CP. Says he was at the foot doctor today and as he was leaving started to feel left peristernal chest pain, described as "sharp" alternating with "pressure", severe, that was associated with mild numbness of the left jaw; denies lightheadedness, diaphoresis, SOB, N/V, weakness.  VS & exam as above. EKG: NSR @ 73bpm w/normal intervals & no signs of ischemia. Low risk per HEAR score. CXR WNL. Labs remarkable for Crt 1.45 (no baseline to compare) and two undetectable troponins. Cause of the Pt's symptoms unclear, but doubt emergent etiology given history, exam, & workup. Recommending f/u with his PCP to discuss further testing as an outpatient.  Explained all results to the Pt. Will discharge the Pt home. Recommending follow-up with PCP. ED return precautions provided. Pt acknowledged understanding of, and concurrence with the plan. All questions answered  to his satisfaction. In stable condition at the time of discharge.   Final Clinical Impressions(s) / ED Diagnoses   Final diagnoses:  Precordial chest pain    ED Discharge Orders    None       Forest Becker, MD 09/15/17 Salley Hews    Rolland Porter, MD 09/15/17 2316

## 2017-09-14 NOTE — Progress Notes (Signed)
   Subjective:    Patient ID: Sean Duffy, male    DOB: [birth year], 62 y.o.   MRN: [id number]  HPI    Review of Systems  Neurological: Positive for dizziness.  All other systems reviewed and are negative.      Objective:   Physical Exam        Assessment & Plan:

## 2017-09-14 NOTE — ED Notes (Signed)
Resident at the bedside

## 2017-09-14 NOTE — Progress Notes (Signed)
Subjective:   Patient ID: Sean Duffy, male   DOB: 62 y.o.   MRN: [id number]   HPI Patient presents with pain on the right and left first metatarsal and states significant callus formation formed right upper lateral and also complains of generalized pain in both feet.  Patient does not smoke currently and likes to be active   Review of Systems  All other systems reviewed and are negative.       Objective:  Physical Exam  Constitutional: He appears well-developed and well-nourished.  Cardiovascular: Intact distal pulses.  Pulmonary/Chest: Effort normal.  Musculoskeletal: Normal range of motion.  Neurological: He is alert.  Skin: Skin is warm.  Nursing note and vitals reviewed.   With some discomfort around the first second and third MPJs of both feet.  Patient is found to have neurovascular status found to be intact muscle strength was adequate range of motion within normal limits quite a bit of discomfort with plantar keratotic lesion sub-first metatarsal right and left on the medial side of the first metatarsal right over left     Assessment:  Inflammatory condition with keratotic lesion and probable inflammatory capsulitis of the lesser MPJs     Plan:  H&P condition reviewed and sharp sterile debridement accomplished of tissue.  Patient tolerated this well and will be seen back for routine care

## 2018-01-23 ENCOUNTER — Ambulatory Visit: Payer: Self-pay | Admitting: Podiatry

## 2018-01-30 ENCOUNTER — Ambulatory Visit (INDEPENDENT_AMBULATORY_CARE_PROVIDER_SITE_OTHER): Payer: Non-veteran care | Admitting: Podiatry

## 2018-01-30 ENCOUNTER — Encounter: Payer: Self-pay | Admitting: Podiatry

## 2018-01-30 DIAGNOSIS — L84 Corns and callosities: Secondary | ICD-10-CM

## 2018-01-31 NOTE — Progress Notes (Signed)
Subjective:   Patient ID: Sean Duffy, male   DOB: 62 y.o.   MRN: [id number]   HPI Patient presents stating that he has calluses on both feet that he wanted to get checked   ROS      Objective:  Physical Exam  Neurovascular status intact with patient found to have callus plantar aspect first metatarsal head bilateral that are painful when pressed and make walking difficult     Assessment:  Keratotic lesion secondary to foot structure bilateral     Plan:  Debride painful lesion formation bilateral with no iatrogenic bleeding noted

## 2020-06-24 ENCOUNTER — Telehealth (HOSPITAL_COMMUNITY): Payer: Self-pay

## 2020-06-24 ENCOUNTER — Other Ambulatory Visit: Payer: Self-pay | Admitting: Nurse Practitioner

## 2020-06-24 DIAGNOSIS — U071 COVID-19: Secondary | ICD-10-CM

## 2020-06-24 NOTE — Progress Notes (Signed)
I connected by phone with Sean Duffy on 06/24/2020 at 10:50 AM to discuss the potential use of a new treatment for mild to moderate COVID-19 viral infection in non-hospitalized patients.  This patient is a 64 y.o. male that meets the FDA criteria for Emergency Use Authorization of COVID monoclonal antibody casirivimab/imdevimab or bamlanivimab/eteseviamb.  Has a (+) direct SARS-CoV-2 viral test result  Has mild or moderate COVID-19   Is NOT hospitalized due to COVID-19  Is within 10 days of symptom onset  Has at least one of the high risk factor(s) for progression to severe COVID-19 and/or hospitalization as defined in [place].  Specific high risk criteria : BMI > 25 and Cardiovascular disease or hypertension   I have spoken and communicated the following to the patient or parent/caregiver regarding COVID monoclonal antibody treatment:  1. FDA has authorized the emergency use for the treatment of mild to moderate COVID-19 in adults and pediatric patients with positive results of direct SARS-CoV-2 viral testing who are 43 years of age and older weighing at least 40 kg, and who are at high risk for progressing to severe COVID-19 and/or hospitalization.  2. The significant known and potential risks and benefits of COVID monoclonal antibody, and the extent to which such potential risks and benefits are unknown.  3. Information on available alternative treatments and the risks and benefits of those alternatives, including clinical trials.  4. Patients treated with COVID monoclonal antibody should continue to self-isolate and use infection control measures (e.g., wear mask, isolate, social distance, avoid sharing personal items, clean and disinfect high touch surfaces, and frequent handwashing) according to CDC guidelines.   5. The patient or parent/caregiver has the option to accept or refuse COVID monoclonal antibody treatment.  After reviewing this information with the patient, the patient  has agreed to receive one of the available covid 19 monoclonal antibodies and will be provided an appropriate fact sheet prior to infusion. [name], NP 06/24/2020 10:50 AM

## 2020-06-25 ENCOUNTER — Ambulatory Visit (HOSPITAL_COMMUNITY)
Admission: RE | Admit: 2020-06-25 | Discharge: 2020-06-25 | Disposition: A | Discharge status: Home / Self Care | Payer: HRSA Program | Source: Ambulatory Visit | Attending: Pulmonary Disease | Admitting: Pulmonary Disease

## 2020-06-25 DIAGNOSIS — U071 COVID-19: Secondary | ICD-10-CM | POA: Diagnosis not present

## 2020-06-25 MED ORDER — EPINEPHRINE 0.3 MG/0.3ML IJ SOAJ: 0.3000 mg | Freq: Once | INTRAMUSCULAR | Status: DC | PRN

## 2020-06-25 MED ORDER — SODIUM CHLORIDE 0.9 % IV SOLN: Freq: Once | INTRAVENOUS | Status: AC

## 2020-06-25 MED ORDER — FAMOTIDINE IN NACL 20-0.9 MG/50ML-% IV SOLN: 20.0000 mg | Freq: Once | INTRAVENOUS | Status: DC | PRN

## 2020-06-25 MED ORDER — SODIUM CHLORIDE 0.9 % IV SOLN: INTRAVENOUS | Status: DC | PRN

## 2020-06-25 MED ORDER — METHYLPREDNISOLONE SODIUM SUCC 125 MG IJ SOLR: 125.0000 mg | Freq: Once | INTRAMUSCULAR | Status: DC | PRN

## 2020-06-25 MED ORDER — ALBUTEROL SULFATE HFA 108 (90 BASE) MCG/ACT IN AERS: 2.0000 | INHALATION_SPRAY | Freq: Once | RESPIRATORY_TRACT | Status: DC | PRN

## 2020-06-25 MED ORDER — DIPHENHYDRAMINE HCL 50 MG/ML IJ SOLN: 50.0000 mg | Freq: Once | INTRAMUSCULAR | Status: DC | PRN

## 2020-06-25 NOTE — Telephone Encounter (Signed)
Done

## 2020-06-25 NOTE — Progress Notes (Signed)
  Diagnosis: COVID-19  Physician:Dr Wright   Procedure: [facility] Med: bamlanivimab\etesevimab infusion - Provided patient with bamlanimivab\etesevimab fact sheet for patients, parents and caregivers prior to infusion.  Complications: No immediate complications noted.  Discharge: Discharged home   Leydy Worthey W 06/25/2020  

## 2020-06-25 NOTE — Discharge Instructions (Signed)
